# Patient Record
Sex: Female | Born: 1980 | Race: Black or African American | Hispanic: No | Smoking: Current every day smoker
Health system: Southern US, Community
[De-identification: ages and names within clinical notes are randomized; demographics above are authoritative.]

## PROBLEM LIST (undated history)

## (undated) DIAGNOSIS — Z789 Other specified health status: Secondary | ICD-10-CM

## (undated) DIAGNOSIS — N809 Endometriosis, unspecified: Secondary | ICD-10-CM

## (undated) DIAGNOSIS — F419 Anxiety disorder, unspecified: Secondary | ICD-10-CM

## (undated) HISTORY — PX: DILATION AND CURETTAGE OF UTERUS: SHX78

## (undated) HISTORY — PX: OTHER SURGICAL HISTORY: SHX169

---
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
## 2001-06-06 ENCOUNTER — Encounter: Payer: Self-pay | Admitting: Emergency Medicine

## 2001-06-06 ENCOUNTER — Emergency Department (HOSPITAL_COMMUNITY): Admission: EM | Admit: 2001-06-06 | Discharge: 2001-06-06 | Payer: Self-pay | Admitting: Emergency Medicine

## 2001-09-24 ENCOUNTER — Emergency Department (HOSPITAL_COMMUNITY): Admission: EM | Admit: 2001-09-24 | Discharge: 2001-09-24 | Payer: Self-pay | Admitting: Emergency Medicine

## 2001-11-16 ENCOUNTER — Emergency Department (HOSPITAL_COMMUNITY): Admission: EM | Admit: 2001-11-16 | Discharge: 2001-11-16 | Payer: Self-pay | Admitting: Emergency Medicine

## 2002-05-22 ENCOUNTER — Emergency Department (HOSPITAL_COMMUNITY): Admission: EM | Admit: 2002-05-22 | Discharge: 2002-05-22 | Payer: Self-pay | Admitting: Emergency Medicine

## 2003-07-04 ENCOUNTER — Inpatient Hospital Stay (HOSPITAL_COMMUNITY): Admission: AD | Admit: 2003-07-04 | Discharge: 2003-07-04 | Payer: Self-pay | Admitting: Obstetrics & Gynecology

## 2003-07-08 ENCOUNTER — Inpatient Hospital Stay (HOSPITAL_COMMUNITY): Admission: AD | Admit: 2003-07-08 | Discharge: 2003-07-08 | Payer: Self-pay | Admitting: Obstetrics & Gynecology

## 2004-01-18 ENCOUNTER — Emergency Department (HOSPITAL_COMMUNITY): Admission: EM | Admit: 2004-01-18 | Discharge: 2004-01-18 | Payer: Self-pay | Admitting: Internal Medicine

## 2004-05-20 ENCOUNTER — Emergency Department (HOSPITAL_COMMUNITY): Admission: EM | Admit: 2004-05-20 | Discharge: 2004-05-20 | Payer: Self-pay | Admitting: Emergency Medicine

## 2005-03-14 ENCOUNTER — Emergency Department (HOSPITAL_COMMUNITY): Admission: EM | Admit: 2005-03-14 | Discharge: 2005-03-14 | Payer: Self-pay | Admitting: Emergency Medicine

## 2005-03-18 ENCOUNTER — Inpatient Hospital Stay (HOSPITAL_COMMUNITY): Admission: AD | Admit: 2005-03-18 | Discharge: 2005-03-18 | Payer: Self-pay | Admitting: Obstetrics

## 2007-01-15 ENCOUNTER — Emergency Department (HOSPITAL_COMMUNITY): Admission: EM | Admit: 2007-01-15 | Discharge: 2007-01-15 | Payer: Self-pay | Admitting: *Deleted

## 2007-02-25 ENCOUNTER — Emergency Department (HOSPITAL_COMMUNITY): Admission: EM | Admit: 2007-02-25 | Discharge: 2007-02-25 | Payer: Self-pay | Admitting: Emergency Medicine

## 2008-04-13 ENCOUNTER — Inpatient Hospital Stay (HOSPITAL_COMMUNITY): Admission: AD | Admit: 2008-04-13 | Discharge: 2008-04-13 | Payer: Self-pay | Admitting: Obstetrics & Gynecology

## 2008-05-05 ENCOUNTER — Emergency Department (HOSPITAL_COMMUNITY): Admission: EM | Admit: 2008-05-05 | Discharge: 2008-05-05 | Payer: Self-pay | Admitting: Emergency Medicine

## 2008-09-11 ENCOUNTER — Inpatient Hospital Stay (HOSPITAL_COMMUNITY): Admission: AD | Admit: 2008-09-11 | Discharge: 2008-09-12 | Payer: Self-pay | Admitting: Obstetrics & Gynecology

## 2009-02-04 ENCOUNTER — Ambulatory Visit: Payer: Self-pay | Admitting: Family Medicine

## 2009-02-04 ENCOUNTER — Inpatient Hospital Stay (HOSPITAL_COMMUNITY): Admission: AD | Admit: 2009-02-04 | Discharge: 2009-02-04 | Payer: Self-pay | Admitting: Obstetrics & Gynecology

## 2009-05-06 ENCOUNTER — Emergency Department (HOSPITAL_COMMUNITY): Admission: EM | Admit: 2009-05-06 | Discharge: 2009-05-06 | Payer: Self-pay | Admitting: Emergency Medicine

## 2009-12-04 ENCOUNTER — Emergency Department (HOSPITAL_COMMUNITY)
Admission: EM | Admit: 2009-12-04 | Discharge: 2009-12-04 | Payer: Self-pay | Source: Home / Self Care | Admitting: Emergency Medicine

## 2010-03-14 LAB — POCT PREGNANCY, URINE: Preg Test, Ur: NEGATIVE

## 2010-03-21 LAB — COMPREHENSIVE METABOLIC PANEL
ALT: 12 U/L (ref 0–35)
AST: 24 U/L (ref 0–37)
Albumin: 4 g/dL (ref 3.5–5.2)
Alkaline Phosphatase: 40 U/L (ref 39–117)
BUN: 14 mg/dL (ref 6–23)
Chloride: 112 mEq/L (ref 96–112)
GFR calc Af Amer: >60 mL/min (ref >60)
Potassium: 4.3 mEq/L (ref 3.5–5.1)
Sodium: 140 mEq/L (ref 135–145)
Total Bilirubin: 1 mg/dL (ref 0.3–1.2)
Total Protein: 7.3 g/dL (ref 6.0–8.3)

## 2010-03-21 LAB — URINALYSIS, ROUTINE W REFLEX MICROSCOPIC
Bilirubin Urine: NEGATIVE
Ketones, ur: NEGATIVE mg/dL
Nitrite: NEGATIVE
Specific Gravity, Urine: 1.024 (ref 1.005–1.030)
pH: 6 (ref 5.0–8.0)

## 2010-03-21 LAB — DIFFERENTIAL
Basophils Relative: 1 % (ref 0–1)
Eosinophils Absolute: 0.2 10*3/uL (ref 0.0–0.7)
Lymphocytes Relative: 33 % (ref 12–46)
Lymphs Abs: 2.5 10*3/uL (ref 0.7–4.0)
Neutro Abs: 4.4 10*3/uL (ref 1.7–7.7)

## 2010-03-21 LAB — GC/CHLAMYDIA PROBE AMP, GENITAL: GC Probe Amp, Genital: NEGATIVE

## 2010-03-21 LAB — CBC
Platelets: ADEQUATE 10*3/uL (ref 150–400)
RDW: 13.1 % (ref 11.5–15.5)
WBC: 7.7 10*3/uL (ref 4.0–10.5)

## 2010-03-21 LAB — WET PREP, GENITAL
Clue Cells Wet Prep HPF POC: NONE SEEN
Trich, Wet Prep: NONE SEEN
WBC, Wet Prep HPF POC: NONE SEEN

## 2010-03-21 LAB — URINE MICROSCOPIC-ADD ON

## 2010-03-21 LAB — HEMOCCULT GUIAC POC 1CARD (OFFICE): Fecal Occult Bld: POSITIVE

## 2010-03-24 LAB — URINALYSIS, ROUTINE W REFLEX MICROSCOPIC
Bilirubin Urine: NEGATIVE
Hgb urine dipstick: NEGATIVE
Nitrite: NEGATIVE
Protein, ur: NEGATIVE mg/dL
Urobilinogen, UA: 0.2 mg/dL (ref 0.0–1.0)

## 2010-04-07 LAB — COMPREHENSIVE METABOLIC PANEL
AST: 23 U/L (ref 0–37)
BUN: 13 mg/dL (ref 6–23)
CO2: 26 mEq/L (ref 19–32)
Chloride: 103 mEq/L (ref 96–112)
Creatinine, Ser: 0.89 mg/dL (ref 0.4–1.2)
GFR calc Af Amer: >60 mL/min (ref >60)
GFR calc non Af Amer: >60 mL/min (ref >60)
Glucose, Bld: 77 mg/dL (ref 70–99)
Total Bilirubin: 0.7 mg/dL (ref 0.3–1.2)

## 2010-04-07 LAB — CBC
HCT: 41 % (ref 36.0–46.0)
Hemoglobin: 13.8 g/dL (ref 12.0–15.0)
MCV: 96.8 fL (ref 78.0–100.0)
RBC: 4.24 MIL/uL (ref 3.87–5.11)
WBC: 8.9 10*3/uL (ref 4.0–10.5)

## 2010-04-07 LAB — WET PREP, GENITAL
Clue Cells Wet Prep HPF POC: NONE SEEN
Trich, Wet Prep: NONE SEEN

## 2010-04-07 LAB — URINALYSIS, ROUTINE W REFLEX MICROSCOPIC
Glucose, UA: NEGATIVE mg/dL
Hgb urine dipstick: NEGATIVE
Ketones, ur: 15 mg/dL — AB
Protein, ur: NEGATIVE mg/dL
Urobilinogen, UA: 0.2 mg/dL (ref 0.0–1.0)

## 2010-04-07 LAB — GC/CHLAMYDIA PROBE AMP, GENITAL
Chlamydia, DNA Probe: NEGATIVE
GC Probe Amp, Genital: NEGATIVE

## 2010-04-25 ENCOUNTER — Inpatient Hospital Stay (HOSPITAL_COMMUNITY)
Admission: AD | Admit: 2010-04-25 | Discharge: 2010-04-25 | Disposition: A | Discharge status: Home / Self Care | Payer: Self-pay | Source: Ambulatory Visit | Attending: Obstetrics & Gynecology | Admitting: Obstetrics & Gynecology

## 2010-04-25 DIAGNOSIS — R109 Unspecified abdominal pain: Secondary | ICD-10-CM

## 2010-04-25 DIAGNOSIS — R197 Diarrhea, unspecified: Secondary | ICD-10-CM

## 2010-04-25 LAB — URINALYSIS, ROUTINE W REFLEX MICROSCOPIC
Glucose, UA: NEGATIVE mg/dL
Protein, ur: NEGATIVE mg/dL

## 2010-04-25 LAB — WET PREP, GENITAL: Trich, Wet Prep: NONE SEEN

## 2010-04-25 LAB — URINE MICROSCOPIC-ADD ON

## 2010-04-26 LAB — GC/CHLAMYDIA PROBE AMP, GENITAL: GC Probe Amp, Genital: NEGATIVE

## 2010-05-10 ENCOUNTER — Emergency Department (HOSPITAL_COMMUNITY)
Admission: EM | Admit: 2010-05-10 | Discharge: 2010-05-10 | Disposition: A | Discharge status: Home / Self Care | Payer: Self-pay | Attending: Emergency Medicine | Admitting: Emergency Medicine

## 2010-05-10 DIAGNOSIS — J069 Acute upper respiratory infection, unspecified: Secondary | ICD-10-CM | POA: Insufficient documentation

## 2010-05-10 DIAGNOSIS — J029 Acute pharyngitis, unspecified: Secondary | ICD-10-CM | POA: Insufficient documentation

## 2010-05-10 DIAGNOSIS — J3489 Other specified disorders of nose and nasal sinuses: Secondary | ICD-10-CM | POA: Insufficient documentation

## 2010-05-10 DIAGNOSIS — R059 Cough, unspecified: Secondary | ICD-10-CM | POA: Insufficient documentation

## 2010-05-10 DIAGNOSIS — R05 Cough: Secondary | ICD-10-CM | POA: Insufficient documentation

## 2010-05-10 LAB — RAPID STREP SCREEN (MED CTR MEBANE ONLY): Streptococcus, Group A Screen (Direct): NEGATIVE

## 2010-09-20 LAB — POCT PREGNANCY, URINE: Preg Test, Ur: NEGATIVE

## 2010-09-22 LAB — URINALYSIS, ROUTINE W REFLEX MICROSCOPIC
Glucose, UA: NEGATIVE
Hgb urine dipstick: NEGATIVE
Protein, ur: 30 — AB
pH: 6.5

## 2010-09-22 LAB — URINE MICROSCOPIC-ADD ON

## 2010-10-24 ENCOUNTER — Encounter (HOSPITAL_COMMUNITY): Payer: Self-pay | Admitting: *Deleted

## 2010-10-24 ENCOUNTER — Inpatient Hospital Stay (HOSPITAL_COMMUNITY)
Admission: AD | Admit: 2010-10-24 | Discharge: 2010-10-24 | Disposition: A | Discharge status: Home / Self Care | Payer: PRIVATE HEALTH INSURANCE | Source: Ambulatory Visit | Attending: Obstetrics & Gynecology | Admitting: Obstetrics & Gynecology

## 2010-10-24 DIAGNOSIS — N898 Other specified noninflammatory disorders of vagina: Secondary | ICD-10-CM

## 2010-10-24 DIAGNOSIS — L293 Anogenital pruritus, unspecified: Secondary | ICD-10-CM

## 2010-10-24 DIAGNOSIS — N899 Noninflammatory disorder of vagina, unspecified: Secondary | ICD-10-CM | POA: Insufficient documentation

## 2010-10-24 HISTORY — DX: Other specified health status: Z78.9

## 2010-10-24 LAB — URINALYSIS, ROUTINE W REFLEX MICROSCOPIC
Glucose, UA: NEGATIVE mg/dL
Nitrite: NEGATIVE
Protein, ur: NEGATIVE mg/dL
Urobilinogen, UA: 1 mg/dL (ref 0.0–1.0)

## 2010-10-24 LAB — WET PREP, GENITAL
Clue Cells Wet Prep HPF POC: NONE SEEN
WBC, Wet Prep HPF POC: NONE SEEN

## 2010-10-24 LAB — POCT PREGNANCY, URINE: Preg Test, Ur: NEGATIVE

## 2010-10-24 LAB — URINE MICROSCOPIC-ADD ON

## 2010-10-24 MED ORDER — TERCONAZOLE 0.4 % VA CREA: 1.0000 | TOPICAL_CREAM | Freq: Every day | VAGINAL | Status: AC

## 2010-10-24 NOTE — Progress Notes (Signed)
Patient states she has been having discomfort with urinating for 2 days. Vaginal burning.

## 2010-10-24 NOTE — ED Provider Notes (Signed)
History     Chief Complaint  Patient presents with  . Dysuria   HPIShareena N Jones29 y.o. reports having discomfort around the urethra when she wipes.  Denies dysuria, hematuria.  LMP 10/03/10, several days longer than normal.   Sexually active X1 new partner for 2 months.   Denies abnormal vaginal bleeding.  Does not use contraception.    Past Medical History  Diagnosis Date  . No pertinent past medical history     Past Surgical History  Procedure Date  . Wisdom teeth extraction     No family history on file.  History  Substance Use Topics  . Smoking status: Current Everyday Smoker -- 0.5 packs/day  . Smokeless tobacco: Not on file  . Alcohol Use: 1.0 oz/week    2 drink(s) per week    Allergies: Not on File  No prescriptions prior to admission    Review of Systems  Genitourinary: Negative for dysuria.       Vaginal discomfort with urination.  Denies abnormal vaginal.    Physical Exam   Blood pressure 110/58, pulse 86, temperature 99.3 F (37.4 C), temperature source Oral, resp. rate 16, height 5\' 2"  (1.575 m), weight 181 lb 9.6 oz (82.373 kg), last menstrual period 10/03/2010, SpO2 97.00%.  Physical Exam  Constitutional: She is oriented to person, place, and time. She appears well-developed and well-nourished. No distress.  GI: Soft. She exhibits no distension and no mass. There is no tenderness. There is no rebound and no guarding.  Genitourinary: Uterus is not enlarged and not tender. Cervix exhibits no motion tenderness, no discharge and no friability. Right adnexum displays no mass, no tenderness and no fullness. Left adnexum displays no mass and no tenderness. No erythema or bleeding around the vagina. Vaginal discharge (thick white discharge without odor, metrogel inserted last night) found.       Tenderness around the urethra.  Neurological: She is alert and oriented to person, place, and time.  Skin: Skin is warm and dry.       Results for orders  placed during the hospital encounter of 10/24/10 (from the past 24 hour(s))  URINALYSIS, ROUTINE W REFLEX MICROSCOPIC     Status: Abnormal   Collection Time   10/24/10  3:35 PM      Component Value Range   Color, Urine YELLOW  YELLOW    Appearance CLEAR  CLEAR    Specific Gravity, Urine 1.025  1.005 - 1.030    pH 6.0  5.0 - 8.0    Glucose, UA NEGATIVE  NEGATIVE (mg/dL)   Hgb urine dipstick NEGATIVE  NEGATIVE    Bilirubin Urine NEGATIVE  NEGATIVE    Ketones, ur NEGATIVE  NEGATIVE (mg/dL)   Protein, ur NEGATIVE  NEGATIVE (mg/dL)   Urobilinogen, UA 1.0  0.0 - 1.0 (mg/dL)   Nitrite NEGATIVE  NEGATIVE    Leukocytes, UA SMALL (*) NEGATIVE   URINE MICROSCOPIC-ADD ON     Status: Abnormal   Collection Time   10/24/10  3:35 PM      Component Value Range   Squamous Epithelial / LPF FEW (*) RARE    WBC, UA 0-2  <3 (WBC/hpf)   Bacteria, UA FEW (*) RARE   POCT PREGNANCY, URINE     Status: Normal   Collection Time   10/24/10  4:09 PM      Component Value Range   Preg Test, Ur NEGATIVE     MAU Course  Procedures  GC/CHl culture to lab  MDM  Assessment and Plan  A: Vaginal irritation  P: informed patient that we would call if her cultures were positive.  She would like tx for yeast to soothe the irritation.  She did use metrogel several weeks ago and feels this may be the cause.    Jenna Hooper,EVE M 10/24/2010, 5:59 PM   Jenna Holmes, NP 10/24/10 1950  Jenna Holmes, NP 10/26/10 1337

## 2010-10-24 NOTE — Progress Notes (Signed)
Pt states she is having pain in her vagina and her lower stomach when she urinates.

## 2010-10-25 LAB — GC/CHLAMYDIA PROBE AMP, GENITAL
Chlamydia, DNA Probe: NEGATIVE
GC Probe Amp, Genital: NEGATIVE

## 2010-10-26 NOTE — ED Provider Notes (Signed)
Attestation of Attending Supervision of Advanced Practitioner: Evaluation and management procedures were performed by the PA/NP/CNM/OB Fellow under my supervision/collaboration. Chart reviewed and agree with management and plan.  Jaynie Collins A M.D. 10/26/2010 5:26 PM

## 2010-10-29 ENCOUNTER — Emergency Department (HOSPITAL_COMMUNITY): Payer: No Typology Code available for payment source

## 2010-10-29 ENCOUNTER — Emergency Department (HOSPITAL_COMMUNITY)
Admission: EM | Admit: 2010-10-29 | Discharge: 2010-10-29 | Disposition: A | Discharge status: Home / Self Care | Payer: No Typology Code available for payment source | Attending: Emergency Medicine | Admitting: Emergency Medicine

## 2010-10-29 DIAGNOSIS — M25529 Pain in unspecified elbow: Secondary | ICD-10-CM | POA: Insufficient documentation

## 2010-10-29 DIAGNOSIS — T1490XA Injury, unspecified, initial encounter: Secondary | ICD-10-CM | POA: Insufficient documentation

## 2010-10-29 DIAGNOSIS — Y92410 Unspecified street and highway as the place of occurrence of the external cause: Secondary | ICD-10-CM | POA: Insufficient documentation

## 2010-10-29 DIAGNOSIS — S5000XA Contusion of unspecified elbow, initial encounter: Secondary | ICD-10-CM | POA: Insufficient documentation

## 2010-11-13 ENCOUNTER — Emergency Department (HOSPITAL_COMMUNITY)
Admission: EM | Admit: 2010-11-13 | Discharge: 2010-11-14 | Discharge status: Against Medical Advice | Payer: PRIVATE HEALTH INSURANCE | Attending: Emergency Medicine | Admitting: Emergency Medicine

## 2010-11-13 ENCOUNTER — Encounter (HOSPITAL_COMMUNITY): Payer: Self-pay | Admitting: Emergency Medicine

## 2010-11-13 DIAGNOSIS — M549 Dorsalgia, unspecified: Secondary | ICD-10-CM | POA: Insufficient documentation
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
## 2010-12-13 ENCOUNTER — Emergency Department (HOSPITAL_COMMUNITY): Payer: Self-pay

## 2010-12-13 ENCOUNTER — Emergency Department (HOSPITAL_COMMUNITY)
Admission: EM | Admit: 2010-12-13 | Discharge: 2010-12-13 | Disposition: A | Discharge status: Home / Self Care | Payer: Self-pay | Attending: Emergency Medicine | Admitting: Emergency Medicine

## 2010-12-13 ENCOUNTER — Encounter (HOSPITAL_COMMUNITY): Payer: Self-pay | Admitting: Emergency Medicine

## 2010-12-13 DIAGNOSIS — R05 Cough: Secondary | ICD-10-CM

## 2010-12-13 DIAGNOSIS — R059 Cough, unspecified: Secondary | ICD-10-CM | POA: Insufficient documentation

## 2010-12-13 DIAGNOSIS — R0789 Other chest pain: Secondary | ICD-10-CM | POA: Insufficient documentation

## 2010-12-13 DIAGNOSIS — R0989 Other specified symptoms and signs involving the circulatory and respiratory systems: Secondary | ICD-10-CM | POA: Insufficient documentation

## 2010-12-13 DIAGNOSIS — R509 Fever, unspecified: Secondary | ICD-10-CM | POA: Insufficient documentation

## 2010-12-13 MED ORDER — IPRATROPIUM BROMIDE 0.02 % IN SOLN
0.5000 mg | RESPIRATORY_TRACT | Status: AC
  Administered 2010-12-13: 0.5 mg via RESPIRATORY_TRACT
  Filled 2010-12-13: qty 2.5

## 2010-12-13 MED ORDER — HYDROCODONE-HOMATROPINE 5-1.5 MG/5ML PO SYRP: 5.0000 mL | ORAL_SOLUTION | Freq: Four times a day (QID) | ORAL | Status: DC | PRN

## 2010-12-13 MED ORDER — PHENYLEPHRINE-CHLORPHEN-DM 12.5-4-15 MG/5ML PO SYRP: 2.5000 mL | ORAL_SOLUTION | ORAL | Status: DC

## 2010-12-13 MED ORDER — ALBUTEROL SULFATE (5 MG/ML) 0.5% IN NEBU
2.5000 mg | INHALATION_SOLUTION | RESPIRATORY_TRACT | Status: AC
  Administered 2010-12-13: 2.5 mg via RESPIRATORY_TRACT
  Filled 2010-12-13: qty 0.5

## 2010-12-13 NOTE — ED Provider Notes (Signed)
History     CSN: [id number] Arrival date & time: 12/13/2010 10:08 AM   First MD Initiated Contact with Patient 12/13/10 1033      Chief Complaint  Patient presents with  . Sore Throat    Fever sore throat x 2 days, cough productive when reclining    (Consider location/radiation/quality/duration/timing/severity/associated sxs/prior treatment) HPI Comments: Patient presents with flulike symptoms consisting of fever, cough and chills that began on Friday.  Sunday the patient took her temperature and had a fever of 103.  She's been treating her cough and fever with over-the-counter therapies and currently does not have a temperature.  She is more concerned about her consistent cough.  Her son was recently diagnosed with an upper respiratory tract infection and she feels as though she caught it.  Patient denies nausea, vomiting, diarrhea, chest pain, shortness of breath, DOE, and abdominal pain.  She has no other complaints.  Patient is a 30 y.o. female presenting with fever and cough. The history is provided by the patient.  Fever Primary symptoms of the febrile illness include fever, cough and wheezing. Primary symptoms do not include fatigue, headaches, shortness of breath, abdominal pain, nausea, vomiting, diarrhea, dysuria or myalgias. The current episode started 6 to 7 days ago. This is a new problem. The problem has not changed since onset. The fever began 2 days ago. The fever has been resolved since its onset. The maximum temperature recorded prior to her arrival was 102 to 102.9 F.  The patient's medical history does not include asthma or COPD.  Cough This is a new problem. The current episode started 2 days ago. The problem occurs constantly. The problem has not changed since onset.The cough is non-productive. The maximum temperature recorded prior to her arrival was 102 to 102.9 F. Associated symptoms include chills and wheezing. Pertinent negatives include no chest pain, no ear pain,  no headaches, no rhinorrhea, no sore throat, no myalgias and no shortness of breath. She has tried cough syrup for the symptoms. The treatment provided mild relief. She is a smoker. Her past medical history does not include pneumonia, COPD or asthma.    Past Medical History  Diagnosis Date  . No pertinent past medical history     Past Surgical History  Procedure Date  . Wisdom teeth extraction     History reviewed. No pertinent family history.  History  Substance Use Topics  . Smoking status: Current Some Day Smoker -- 0.5 packs/day  . Smokeless tobacco: Not on file  . Alcohol Use: No    OB History    Grav Para Term Preterm Abortions TAB SAB Ect Mult Living   2 1 1  0 1 1 0 0 0 1      Review of Systems  Constitutional: Positive for fever and chills. Negative for appetite change and fatigue.  HENT: Negative for hearing loss, ear pain, nosebleeds, congestion, sore throat, rhinorrhea, sneezing, trouble swallowing, neck stiffness, voice change, postnasal drip, sinus pressure, tinnitus and ear discharge.   Eyes: Negative for photophobia and visual disturbance.  Respiratory: Positive for cough, chest tightness and wheezing. Negative for apnea, choking, shortness of breath and stridor.   Cardiovascular: Negative for chest pain, palpitations and leg swelling.  Gastrointestinal: Negative for nausea, vomiting, abdominal pain, diarrhea and constipation.  Genitourinary: Negative for dysuria, urgency and flank pain.  Musculoskeletal: Negative for myalgias.  Neurological: Negative for dizziness, seizures, syncope, weakness, light-headedness, numbness and headaches.  Psychiatric/Behavioral: Negative for behavioral problems and confusion.  All other  systems reviewed and are negative.    Allergies  Review of patient's allergies indicates no known allergies.  Home Medications   Current Outpatient Rx  Name Route Sig Dispense Refill  . CYCLOBENZAPRINE HCL 10 MG PO TABS Oral Take 10 mg by  mouth 3 (three) times daily as needed. SPASMS     . IBUPROFEN 800 MG PO TABS Oral Take 800 mg by mouth every 8 (eight) hours as needed. For pain.     Marland Kitchen LORATADINE 10 MG PO TABS Oral Take 10 mg by mouth daily as needed. For allergies.     Marland Kitchen METHOCARBAMOL 500 MG PO TABS Oral Take 500 mg by mouth 4 (four) times daily.      Juanita Laster FLU/COLD/COUGH PO Oral Take 30 mLs by mouth every 4 (four) hours as needed. FLU SYMPTOMS     . TRAMADOL HCL 50 MG PO TBDP Oral Take 50 mg by mouth daily.        BP 96/60  Pulse 68  Temp(Src) 98 F (36.7 C) (Oral)  Resp 22  SpO2 100%  LMP 11/29/2010  Physical Exam  Nursing note and vitals reviewed. Constitutional: She is oriented to person, place, and time. She appears well-developed and well-nourished. No distress.  HENT:  Head: No trismus in the jaw.  Right Ear: Tympanic membrane, external ear and ear canal normal.  Left Ear: Tympanic membrane, external ear and ear canal normal.  Nose: Nose normal.  Mouth/Throat: Uvula is midline and mucous membranes are normal. Mucous membranes are not pale and not dry. No uvula swelling. No oropharyngeal exudate, posterior oropharyngeal edema, posterior oropharyngeal erythema or tonsillar abscesses.  Eyes:       Normal appearance  Neck: Normal range of motion and full passive range of motion without pain. Neck supple. No rigidity.  Cardiovascular: Normal rate and regular rhythm.   Pulmonary/Chest: Effort normal. She has wheezes. She exhibits no tenderness.  Lymphadenopathy:    She has no cervical adenopathy.  Neurological: She is alert and oriented to person, place, and time.  Skin: Skin is warm and dry. No rash noted.  Psychiatric: She has a normal mood and affect. Her behavior is normal.    ED Course  Procedures (including critical care time)  Labs Reviewed - No data to display Dg Chest 2 View  12/13/2010  *RADIOLOGY REPORT*  Clinical Data: Cough, weakness, fever  CHEST - 2 VIEW  Comparison: 12/04/2009   Findings: Cardiomediastinal silhouette is stable.  No acute infiltrate or pleural effusion.  Central mild increase bronchial markings suspicious for mild bronchitic changes.  IMPRESSION: No acute infiltrate or pleural effusion.  Central mild increase bronchial markings suspicious for bronchitic changes.  Original Report Authenticated By: Natasha Mead, M.D.     1. Cough   2. Congestion     Patient's lungs clear to auscultation after neb treatment.  Chest x-ray ordered to rule out pneumonia.  Patient to be discharged with Hycodan for both pain and cough.  X-ray results discussed with patient.  She states she will followup with a primary care Dr.  MDM  Cough, Fever    Medical screening examination/treatment/procedure(s) were performed by non-physician practitioner and as supervising physician I was immediately available for consultation/collaboration. Osvaldo Human, M.D.    [place], [place] 12/13/10 1201  Carleene Cooper III, MD 12/13/10 [phone]

## 2010-12-13 NOTE — ED Notes (Signed)
Respiratory Tx at bedside

## 2011-03-02 ENCOUNTER — Emergency Department (HOSPITAL_COMMUNITY)
Admission: EM | Admit: 2011-03-02 | Discharge: 2011-03-02 | Disposition: A | Discharge status: Home / Self Care | Payer: Self-pay | Attending: Emergency Medicine | Admitting: Emergency Medicine

## 2011-03-02 ENCOUNTER — Encounter (HOSPITAL_COMMUNITY): Payer: Self-pay | Admitting: *Deleted

## 2011-03-02 DIAGNOSIS — K044 Acute apical periodontitis of pulpal origin: Secondary | ICD-10-CM | POA: Insufficient documentation

## 2011-03-02 DIAGNOSIS — K047 Periapical abscess without sinus: Secondary | ICD-10-CM

## 2011-03-02 DIAGNOSIS — K029 Dental caries, unspecified: Secondary | ICD-10-CM | POA: Insufficient documentation

## 2011-03-02 DIAGNOSIS — F17200 Nicotine dependence, unspecified, uncomplicated: Secondary | ICD-10-CM | POA: Insufficient documentation

## 2011-03-02 MED ORDER — PENICILLIN V POTASSIUM 500 MG PO TABS: 500.0000 mg | ORAL_TABLET | Freq: Four times a day (QID) | ORAL | Status: DC

## 2011-03-02 MED ORDER — OXYCODONE-ACETAMINOPHEN 5-325 MG PO TABS: 1.0000 | ORAL_TABLET | Freq: Four times a day (QID) | ORAL | Status: AC | PRN

## 2011-03-02 MED ORDER — PENICILLIN V POTASSIUM 500 MG PO TABS: 500.0000 mg | ORAL_TABLET | Freq: Four times a day (QID) | ORAL | Status: AC

## 2011-03-02 MED ORDER — OXYCODONE-ACETAMINOPHEN 5-325 MG PO TABS
1.0000 | ORAL_TABLET | Freq: Once | ORAL | Status: AC
  Administered 2011-03-02: 1 via ORAL
  Filled 2011-03-02: qty 1

## 2011-03-02 NOTE — ED Notes (Signed)
Pt reports toothache since November.  Pain increased tonight.  Lower (L) molar pain.  No swelling noted.  Pt very tearful.

## 2011-03-02 NOTE — ED Notes (Signed)
Pt appears in great distress is guarding mouth.

## 2011-03-02 NOTE — ED Provider Notes (Signed)
Medical screening examination/treatment/procedure(s) were performed by non-physician practitioner and as supervising physician I was immediately available for consultation/collaboration.   Vida Roller, MD 03/02/11 [phone]

## 2011-03-02 NOTE — ED Provider Notes (Signed)
History     CSN: [id number]  Arrival date & time 03/02/11  7829   First MD Initiated Contact with Patient 03/02/11 [phone]      Chief Complaint  Patient presents with  . Dental Pain    (Consider location/radiation/quality/duration/timing/severity/associated sxs/prior treatment) Patient is a 31 y.o. female presenting with tooth pain. The history is provided by the patient.  Dental Pain Pt reports dental pain to bottom left 2nd molar x 4 months, gradually worsening until last night when it became too severe for her to sleep. It has been associated with nausea x 2 weeks as well as mild HA. There has been no fever, chills, visual change, ear pain, tinnitus, trouble swallowing, neck pain/stiffness, SP, SOB, or vomiting. She is a smoker. Nothing makes the symptoms better or worse. No prior tx.  Past Medical History  Diagnosis Date  . No pertinent past medical history     Past Surgical History  Procedure Date  . Wisdom teeth extraction     History reviewed. No pertinent family history.  History  Substance Use Topics  . Smoking status: Current Some Day Smoker -- 0.5 packs/day  . Smokeless tobacco: Not on file  . Alcohol Use: No    OB History    Grav Para Term Preterm Abortions TAB SAB Ect Mult Living   2 1 1  0 1 1 0 0 0 1      Review of Systems 10 systems reviewed and are negative for acute change except as noted in the HPI.  Allergies  Review of patient's allergies indicates no known allergies.  Home Medications   Current Outpatient Rx  Name Route Sig Dispense Refill  . BC FAST PAIN RELIEF PO Oral Take 1 packet by mouth daily as needed. For tooth pain    . CYCLOBENZAPRINE HCL 10 MG PO TABS Oral Take 10 mg by mouth 3 (three) times daily as needed. SPASMS     . IBUPROFEN 800 MG PO TABS Oral Take 800 mg by mouth every 8 (eight) hours as needed. For pain.     Marland Kitchen METHOCARBAMOL 500 MG PO TABS Oral Take 500 mg by mouth 4 (four) times daily as needed. For muscle spasms      BP  126/75  Pulse 105  Temp(Src) 99.2 F (37.3 C) (Oral)  Resp 24  SpO2 96%  Physical Exam  Constitutional: She is oriented to person, place, and time. She appears well-developed and well-nourished. She appears distressed.  HENT:  Head: Normocephalic and atraumatic. No trismus in the jaw.  Right Ear: External ear normal.  Left Ear: External ear normal.  Mouth/Throat: Uvula is midline. No uvula swelling. No oropharyngeal exudate, posterior oropharyngeal edema or posterior oropharyngeal erythema.    Eyes: Conjunctivae are normal. Pupils are equal, round, and reactive to light.  Neck: Normal range of motion. Neck supple.       Left anterior cervical LAD.   Cardiovascular: Regular rhythm and intact distal pulses.        Rate 100 on palpation at time of examination  Pulmonary/Chest: Effort normal. No stridor. No respiratory distress.  Musculoskeletal: She exhibits no edema and no tenderness.  Neurological: She is alert and oriented to person, place, and time. No cranial nerve deficit.  Skin: Skin is warm and dry.    ED Course  Procedures (including critical care time)  Labs Reviewed - No data to display No results found.   1. Dental infection       MDM  Dental pain in  otherwise healthy female. Suspect dental abscess given likely longstanding caries with current PE findings. No fever. No s/s ludwig angina. Strongly advised close dental follow-up. Pt and family voice understanding. Will give rx for pcn and pain medication.        Shaaron Adler, [place] 03/02/11 [phone]

## 2011-08-08 ENCOUNTER — Other Ambulatory Visit (HOSPITAL_COMMUNITY)
Admission: RE | Admit: 2011-08-08 | Discharge: 2011-08-08 | Disposition: A | Discharge status: Home / Self Care | Payer: Commercial Indemnity | Source: Ambulatory Visit | Attending: Family Medicine | Admitting: Family Medicine

## 2011-08-08 DIAGNOSIS — Z124 Encounter for screening for malignant neoplasm of cervix: Secondary | ICD-10-CM | POA: Insufficient documentation

## 2011-08-08 DIAGNOSIS — Z1151 Encounter for screening for human papillomavirus (HPV): Secondary | ICD-10-CM | POA: Insufficient documentation

## 2013-07-03 ENCOUNTER — Observation Stay (HOSPITAL_COMMUNITY)
Admission: AD | Admit: 2013-07-03 | Discharge: 2013-07-04 | Disposition: A | Discharge status: Home / Self Care | Payer: Self-pay | Source: Ambulatory Visit | Attending: Obstetrics and Gynecology | Admitting: Obstetrics and Gynecology

## 2013-07-03 ENCOUNTER — Inpatient Hospital Stay (HOSPITAL_COMMUNITY): Payer: PRIVATE HEALTH INSURANCE

## 2013-07-03 ENCOUNTER — Encounter (HOSPITAL_COMMUNITY): Payer: Self-pay

## 2013-07-03 DIAGNOSIS — N83201 Unspecified ovarian cyst, right side: Secondary | ICD-10-CM | POA: Diagnosis not present

## 2013-07-03 DIAGNOSIS — N898 Other specified noninflammatory disorders of vagina: Principal | ICD-10-CM | POA: Insufficient documentation

## 2013-07-03 DIAGNOSIS — R102 Pelvic and perineal pain unspecified side: Secondary | ICD-10-CM | POA: Diagnosis present

## 2013-07-03 DIAGNOSIS — O26899 Other specified pregnancy related conditions, unspecified trimester: Secondary | ICD-10-CM | POA: Diagnosis present

## 2013-07-03 DIAGNOSIS — R112 Nausea with vomiting, unspecified: Secondary | ICD-10-CM | POA: Diagnosis present

## 2013-07-03 DIAGNOSIS — F17200 Nicotine dependence, unspecified, uncomplicated: Secondary | ICD-10-CM | POA: Diagnosis present

## 2013-07-03 DIAGNOSIS — N939 Abnormal uterine and vaginal bleeding, unspecified: Secondary | ICD-10-CM | POA: Diagnosis present

## 2013-07-03 DIAGNOSIS — N946 Dysmenorrhea, unspecified: Secondary | ICD-10-CM | POA: Diagnosis present

## 2013-07-03 HISTORY — DX: Endometriosis, unspecified: N80.9

## 2013-07-03 HISTORY — DX: Anxiety disorder, unspecified: F41.9

## 2013-07-03 LAB — CBC WITH DIFFERENTIAL/PLATELET
BASOS ABS: 0 10*3/uL (ref 0.0–0.1)
Basophils Relative: 1 % (ref 0–1)
Eosinophils Absolute: 0.1 10*3/uL (ref 0.0–0.7)
Eosinophils Relative: 2 % (ref 0–5)
HEMATOCRIT: 38.1 % (ref 36.0–46.0)
Hemoglobin: 13.1 g/dL (ref 12.0–15.0)
LYMPHS PCT: 36 % (ref 12–46)
Lymphs Abs: 2.6 10*3/uL (ref 0.7–4.0)
MCH: 32.4 pg (ref 26.0–34.0)
MCHC: 34.4 g/dL (ref 30.0–36.0)
MCV: 94.3 fL (ref 78.0–100.0)
MONO ABS: 0.4 10*3/uL (ref 0.1–1.0)
MONOS PCT: 5 % (ref 3–12)
Neutro Abs: 4.2 10*3/uL (ref 1.7–7.7)
Neutrophils Relative %: 56 % (ref 43–77)
Platelets: 232 10*3/uL (ref 150–400)
RBC: 4.04 MIL/uL (ref 3.87–5.11)
RDW: 12.9 % (ref 11.5–15.5)
WBC: 7.3 10*3/uL (ref 4.0–10.5)

## 2013-07-03 LAB — URINE MICROSCOPIC-ADD ON

## 2013-07-03 LAB — URINALYSIS, ROUTINE W REFLEX MICROSCOPIC
Bilirubin Urine: NEGATIVE
Glucose, UA: NEGATIVE mg/dL
Ketones, ur: 15 mg/dL — AB
NITRITE: NEGATIVE
Protein, ur: NEGATIVE mg/dL
Specific Gravity, Urine: 1.025 (ref 1.005–1.030)
Urobilinogen, UA: 1 mg/dL (ref 0.0–1.0)
pH: 6.5 (ref 5.0–8.0)

## 2013-07-03 LAB — POCT PREGNANCY, URINE: Preg Test, Ur: NEGATIVE

## 2013-07-03 MED ORDER — KETOROLAC TROMETHAMINE 30 MG/ML IJ SOLN: 30.0000 mg | Freq: Once | INTRAMUSCULAR | Status: DC

## 2013-07-03 MED ORDER — LACTATED RINGERS IV SOLN
INTRAVENOUS | Status: DC
  Administered 2013-07-03: 23:00:00 via INTRAVENOUS

## 2013-07-03 MED ORDER — LACTATED RINGERS IV SOLN: INTRAVENOUS | Status: DC

## 2013-07-03 MED ORDER — HYDROMORPHONE HCL PF 1 MG/ML IJ SOLN
2.0000 mg | INTRAMUSCULAR | Status: DC | PRN
  Administered 2013-07-04: 2 mg via INTRAVENOUS
  Filled 2013-07-03: qty 2

## 2013-07-03 MED ORDER — PROMETHAZINE HCL 25 MG/ML IJ SOLN: 25.0000 mg | Freq: Four times a day (QID) | INTRAMUSCULAR | Status: DC | PRN

## 2013-07-03 MED ORDER — LACTATED RINGERS IV BOLUS (SEPSIS)
250.0000 mL | Freq: Once | INTRAVENOUS | Status: AC
  Administered 2013-07-03: 250 mL via INTRAVENOUS

## 2013-07-03 MED ORDER — KETOROLAC TROMETHAMINE 30 MG/ML IJ SOLN
60.0000 mg | Freq: Once | INTRAMUSCULAR | Status: AC
  Administered 2013-07-03: 60 mg via INTRAMUSCULAR
  Filled 2013-07-03: qty 2

## 2013-07-03 MED ORDER — PROMETHAZINE HCL 25 MG/ML IJ SOLN
25.0000 mg | Freq: Once | INTRAMUSCULAR | Status: AC
  Administered 2013-07-03: 25 mg via INTRAVENOUS
  Filled 2013-07-03: qty 1

## 2013-07-03 MED ORDER — HYDROMORPHONE HCL PF 1 MG/ML IJ SOLN
2.0000 mg | INTRAMUSCULAR | Status: DC | PRN
  Filled 2013-07-03: qty 2

## 2013-07-03 MED ORDER — PNEUMOCOCCAL VAC POLYVALENT 25 MCG/0.5ML IJ INJ
0.5000 mL | INJECTION | INTRAMUSCULAR | Status: DC
  Filled 2013-07-03: qty 0.5

## 2013-07-03 MED ORDER — HYDROMORPHONE HCL PF 1 MG/ML IJ SOLN
1.0000 mg | INTRAMUSCULAR | Status: DC | PRN
  Administered 2013-07-03: 1 mg via INTRAVENOUS
  Filled 2013-07-03: qty 1

## 2013-07-03 MED ORDER — KETOROLAC TROMETHAMINE 30 MG/ML IJ SOLN
30.0000 mg | Freq: Four times a day (QID) | INTRAMUSCULAR | Status: DC | PRN
  Administered 2013-07-04: 30 mg via INTRAVENOUS
  Filled 2013-07-03: qty 1

## 2013-07-03 MED ORDER — HYDROMORPHONE HCL PF 1 MG/ML IJ SOLN
1.0000 mg | Freq: Once | INTRAMUSCULAR | Status: AC
  Administered 2013-07-03: 1 mg via INTRAVENOUS
  Filled 2013-07-03: qty 1

## 2013-07-03 MED ORDER — ONDANSETRON HCL 4 MG/2ML IJ SOLN
4.0000 mg | Freq: Once | INTRAMUSCULAR | Status: AC
  Administered 2013-07-03: 4 mg via INTRAVENOUS
  Filled 2013-07-03: qty 2

## 2013-07-03 NOTE — MAU Provider Note (Signed)
History   Pt presents to MAU for abd pain x 3 month and vaginal bleeding since 6/19, SP US, started on low dose OCPs and possible dx of endometriosis.  Pt also c/o n/v.  On Phenergen without benefit.   There are no active problems to display for this patient.   Chief Complaint  Patient presents with  . Abdominal Pain  . Vaginal Bleeding   HPI  See HPI above  OB History   Grav Para Term Preterm Abortions TAB SAB Ect Mult Living   2 1 1  0 1 1 0 0 0 1      Past Medical History  Diagnosis Date  . No pertinent past medical history   . Endometriosis   . Anxiety     Past Surgical History  Procedure Laterality Date  . Wisdom teeth extraction    . Dilation and curettage of uterus      History reviewed. No pertinent family history.  History  Substance Use Topics  . Smoking status: Current Every Day Smoker -- 0.50 packs/day  . Smokeless tobacco: Never Used  . Alcohol Use: No    Allergies: No Known Allergies  Prescriptions prior to admission  Medication Sig Dispense Refill  . cetirizine (ZYRTEC) 10 MG chewable tablet Chew 10 mg by mouth daily as needed for allergies.      Marland Kitchen. HYDROcodone-acetaminophen (NORCO/VICODIN) 5-325 MG per tablet Take 1-2 tablets by mouth every 6 (six) hours as needed for moderate pain.      Marland Kitchen. levonorgestrel-ethinyl estradiol (AVIANE,ALESSE,LESSINA) 0.1-20 MG-MCG tablet Take 1 tablet by mouth daily.      . naproxen (NAPROSYN) 375 MG tablet Take 375 mg by mouth 3 (three) times daily as needed for mild pain.      . promethazine (PHENERGAN) 25 MG tablet Take 25 mg by mouth every 6 (six) hours as needed for nausea or vomiting.        ROS Physical Exam   Blood pressure 113/71, pulse 72, temperature 98.8 F (37.1 C), temperature source Oral, resp. rate 16, height 5\' 2"  (1.575 m), weight 170 lb (77.111 kg), last menstrual period 06/19/2013.  Physical Exam SSE - minimum blood in the vaginal vault SVE - firm, no CMT, adnexa significant right lower  tenderness, no masses noted.  Left adnexa tender to palpation. No masses noted. Uterus small non tender. Non distended, no upper abd tenderness.  Difficult to access rebound d/t pelvic pain Negative UPT.  Results for orders placed during the hospital encounter of 07/03/13 (from the past 24 hour(s))  URINALYSIS, ROUTINE W REFLEX MICROSCOPIC     Status: Abnormal   Collection Time    07/03/13  4:45 PM      Result Value Ref Range   Color, Urine YELLOW  YELLOW   APPearance CLEAR  CLEAR   Specific Gravity, Urine 1.025  1.005 - 1.030   pH 6.5  5.0 - 8.0   Glucose, UA NEGATIVE  NEGATIVE mg/dL   Hgb urine dipstick LARGE (*) NEGATIVE   Bilirubin Urine NEGATIVE  NEGATIVE   Ketones, ur 15 (*) NEGATIVE mg/dL   Protein, ur NEGATIVE  NEGATIVE mg/dL   Urobilinogen, UA 1.0  0.0 - 1.0 mg/dL   Nitrite NEGATIVE  NEGATIVE   Leukocytes, UA TRACE (*) NEGATIVE  URINE MICROSCOPIC-ADD ON     Status: Abnormal   Collection Time    07/03/13  4:45 PM      Result Value Ref Range   Squamous Epithelial / LPF FEW (*) RARE  WBC, UA 3-6  <3 WBC/hpf   RBC / HPF 7-10  <3 RBC/hpf   Bacteria, UA FEW (*) RARE   Urine-Other MUCOUS PRESENT    POCT PREGNANCY, URINE     Status: None   Collection Time    07/03/13  4:49 PM      Result Value Ref Range   Preg Test, Ur NEGATIVE  NEGATIVE    ED Course  Assessment: Generalized pelvic pain, right greater than the left.   Hx of right corpus luteum cyst.   Minor vaginal bleeding secondary to new OCP start on 06/19/13  Plan: Tordadol 60mg  IM Consulted with Dr. Normand Sloopillard CBC w/diff US for RLQ pain,, RO torson IV hydration Zofran for nausea Dilaudid 1mg  q2 PRN Gerrit HeckJessica Emly CNM to FU.   Urine for culture    Coron Rossano CNM, MSN 07/03/2013 6:35 PM Pt

## 2013-07-03 NOTE — H&P (Signed)
Jenna Hooper is an 33 y.o. female. She was seen in the office on 6/18 and noted as having a right sided corpus luteum cyst that was resolving. However, patient presents this evening for abdominal pain and vaginal bleeding.  Right side abdominal/pelvic pain continues and is unrelieved with rx pain meds.  Patient also experiencing nausea related to the pain and is currently taking phenergan.  Patient given toradol, dilaudid, and zofran upon arrival.  Patient sent for US with results as below.  CBC, UA, and UPT completed and documented as below. Patient continues to complain of nausea and pain.   Pertinent Gynecological History: ontraception: OCP (estrogen/progesterone) for suspected endometriosis  Blood transfusions: none Sexually transmitted diseases: Unknown history Previous GYN Procedures: None  Last pap: normal Date: 05/26/13 OB History: G2, P1011   Menstrual History: Menarche age: [age]  Patient's last menstrual period was 06/19/2013.    Past Medical History  Diagnosis Date  . No pertinent past medical history   . Endometriosis   . Anxiety     Past Surgical History  Procedure Laterality Date  . Wisdom teeth extraction    . Dilation and curettage of uterus      History reviewed. No pertinent family history.  Social History:  reports that she has been smoking.  She has never used smokeless tobacco. She reports that she does not drink alcohol or use illicit drugs.  Allergies: No Known Allergies  Prescriptions prior to admission  Medication Sig Dispense Refill  . cetirizine (ZYRTEC) 10 MG chewable tablet Chew 10 mg by mouth daily as needed for allergies.      Marland Kitchen. HYDROcodone-acetaminophen (NORCO/VICODIN) 5-325 MG per tablet Take 1-2 tablets by mouth every 6 (six) hours as needed for moderate pain.      Marland Kitchen. levonorgestrel-ethinyl estradiol (AVIANE,ALESSE,LESSINA) 0.1-20 MG-MCG tablet Take 1 tablet by mouth daily.      . naproxen (NAPROSYN) 375 MG tablet Take 375 mg by mouth 3 (three)  times daily as needed for mild pain.      . promethazine (PHENERGAN) 25 MG tablet Take 25 mg by mouth every 6 (six) hours as needed for nausea or vomiting.        Review of Systems  Constitutional: Negative.   HENT: Negative.   Eyes: Negative.   Respiratory: Negative.   Cardiovascular: Negative.   Gastrointestinal: Positive for nausea, vomiting and abdominal pain. Negative for heartburn, diarrhea, constipation and blood in stool.  Genitourinary: Negative.   Skin: Negative.   Endo/Heme/Allergies: Negative.    Blood pressure 113/71, pulse 72, temperature 98.8 F (37.1 C), temperature source Oral, resp. rate 16, height 5\' 2"  (1.575 m), weight 170 lb (77.111 kg), last menstrual period 06/19/2013. Physical Exam  Results for orders placed during the hospital encounter of 07/03/13 (from the past 24 hour(s))  URINALYSIS, ROUTINE W REFLEX MICROSCOPIC     Status: Abnormal   Collection Time    07/03/13  4:45 PM      Result Value Ref Range   Color, Urine YELLOW  YELLOW   APPearance CLEAR  CLEAR   Specific Gravity, Urine 1.025  1.005 - 1.030   pH 6.5  5.0 - 8.0   Glucose, UA NEGATIVE  NEGATIVE mg/dL   Hgb urine dipstick LARGE (*) NEGATIVE   Bilirubin Urine NEGATIVE  NEGATIVE   Ketones, ur 15 (*) NEGATIVE mg/dL   Protein, ur NEGATIVE  NEGATIVE mg/dL   Urobilinogen, UA 1.0  0.0 - 1.0 mg/dL   Nitrite NEGATIVE  NEGATIVE   Leukocytes,  UA TRACE (*) NEGATIVE  URINE MICROSCOPIC-ADD ON     Status: Abnormal   Collection Time    07/03/13  4:45 PM      Result Value Ref Range   Squamous Epithelial / LPF FEW (*) RARE   WBC, UA 3-6  <3 WBC/hpf   RBC / HPF 7-10  <3 RBC/hpf   Bacteria, UA FEW (*) RARE   Urine-Other MUCOUS PRESENT    POCT PREGNANCY, URINE     Status: None   Collection Time    07/03/13  4:49 PM      Result Value Ref Range   Preg Test, Ur NEGATIVE  NEGATIVE  CBC WITH DIFFERENTIAL     Status: None   Collection Time    07/03/13  7:21 PM      Result Value Ref Range   WBC 7.3  4.0 -  10.5 K/uL   RBC 4.04  3.87 - 5.11 MIL/uL   Hemoglobin 13.1  12.0 - 15.0 g/dL   HCT 52.838.1  41.336.0 - 24.446.0 %   MCV 94.3  78.0 - 100.0 fL   MCH 32.4  26.0 - 34.0 pg   MCHC 34.4  30.0 - 36.0 g/dL   RDW 01.012.9  27.211.5 - 53.615.5 %   Platelets 232  150 - 400 K/uL   Neutrophils Relative % 56  43 - 77 %   Neutro Abs 4.2  1.7 - 7.7 K/uL   Lymphocytes Relative 36  12 - 46 %   Lymphs Abs 2.6  0.7 - 4.0 K/uL   Monocytes Relative 5  3 - 12 %   Monocytes Absolute 0.4  0.1 - 1.0 K/uL   Eosinophils Relative 2  0 - 5 %   Eosinophils Absolute 0.1  0.0 - 0.7 K/uL   Basophils Relative 1  0 - 1 %   Basophils Absolute 0.0  0.0 - 0.1 K/uL    [place] Transvaginal Non-ob/Pelvis Complete/ US Art/Ven Flow Abd Pelv Doppler  07/03/2013   CLINICAL DATA:  Right lower quadrant pain.  EXAM: TRANSABDOMINAL AND TRANSVAGINAL ULTRASOUND OF PELVIS  DOPPLER ULTRASOUND OF OVARIES  TECHNIQUE: Both transabdominal and transvaginal ultrasound examinations of the pelvis were performed. Transabdominal technique was performed for global imaging of the pelvis including uterus, ovaries, adnexal regions, and pelvic cul-de-sac.  It was necessary to proceed with endovaginal exam following the transabdominal exam to visualize the uterus, endometrium, ovaries and adnexa . Color and duplex Doppler ultrasound was utilized to evaluate blood flow to the ovaries.  COMPARISON:  09/11/2008  FINDINGS: Uterus  Measurements: 7.2 x 4.9 x 5.1 cm. No fibroids or other mass visualized. Uterus is retroverted.  Endometrium  Thickness: 3 mm in thickness.  No focal abnormality visualized.  Right ovary  Measurements: 3.2 x 1.4 x 1.4 cm. Normal appearance/no adnexal mass.  Left ovary  Measurements: Not visualized transabdominally or transvaginally. No adnexal masses  Other findings:  Small amount of free fluid in the pelvis.  Pulsed Doppler evaluation of both adnexa demonstrates normal low-resistance arterial and venous waveforms in the visualized right ovary.  Other findings  No free  fluid.  IMPRESSION: Retroverted uterus.  Nonvisualization of the left ovary.  No abnormality visualized.   Electronically Signed   By: Charlett NoseKevin  Dover M.D.   On: 07/03/2013 21:48    Assessment/Plan: 33 y.o. Female Pelvic Pain Uncontrolled Pain Nausea  Admit to [facility] Unit per Consult with Dr. Dorris CarnesN. Shandi Godfrey Toradol 30mg  IV Q6 Hrs PRN Phenergan 25mg  IV Q6hrs PRN Dilaudid 2mg  IV  Q4hrs PRN LR Continuous Infusion CBC with Diff in AM GC/CT via Urine Specimen Observation throughout the night Will reassess in am   EMLY, JESSICA LYNN 07/03/2013, 10:04 PM

## 2013-07-03 NOTE — MAU Note (Signed)
Had US 3 wks, ? Endometriosis. ? Ruptured cyst.  Was put on BC ? To control endometriosis.  Has been bleeding since the 19th. Never had a cycle to last this long. Wants the pain to stop and the bleeding to stop.

## 2013-07-03 NOTE — MAU Note (Signed)
Pt states has been bleeding since 19th of June. Was at MD office and was told she had ?endometriosis on June 26th. Has cyst on r ovary. Lower abd pain moreso on right side that is constant. Has taken cumulative bottle of naproxen.

## 2013-07-03 NOTE — Progress Notes (Signed)
Notified of pt's c/o lower abd pain, hx ovarian cyst r side, ?endometriosis, u/a sent. Will see pt shortly.

## 2013-07-03 NOTE — MAU Note (Signed)
Pt called to be seen. No response

## 2013-07-04 DIAGNOSIS — R102 Pelvic and perineal pain: Secondary | ICD-10-CM

## 2013-07-04 DIAGNOSIS — O26899 Other specified pregnancy related conditions, unspecified trimester: Secondary | ICD-10-CM | POA: Diagnosis present

## 2013-07-04 LAB — CBC WITH DIFFERENTIAL/PLATELET
BASOS PCT: 1 % (ref 0–1)
Eosinophils Relative: 3 % (ref 0–5)
HCT: 36.9 % (ref 36.0–46.0)
HEMOGLOBIN: 12.2 g/dL (ref 12.0–15.0)
Lymphocytes Relative: 44 % (ref 12–46)
MCH: 31.7 pg (ref 26.0–34.0)
MCHC: 33.1 g/dL (ref 30.0–36.0)
MCV: 95.8 fL (ref 78.0–100.0)
Monocytes Relative: 8 % (ref 3–12)
Neutrophils Relative %: 44 % (ref 43–77)
Platelets: 207 10*3/uL (ref 150–400)
RBC: 3.85 MIL/uL — ABNORMAL LOW (ref 3.87–5.11)
RDW: 13.1 % (ref 11.5–15.5)
WBC: 6.4 10*3/uL (ref 4.0–10.5)

## 2013-07-04 MED ORDER — OXYCODONE-ACETAMINOPHEN 5-325 MG PO TABS
1.0000 | ORAL_TABLET | ORAL | Status: DC | PRN
  Administered 2013-07-04: 2 via ORAL
  Filled 2013-07-04: qty 2

## 2013-07-04 MED ORDER — PROMETHAZINE HCL 25 MG PO TABS: 25.0000 mg | ORAL_TABLET | Freq: Four times a day (QID) | ORAL | Status: DC | PRN

## 2013-07-04 MED ORDER — OXYCODONE-ACETAMINOPHEN 5-325 MG PO TABS: 1.0000 | ORAL_TABLET | ORAL | Status: DC | PRN

## 2013-07-04 NOTE — Discharge Summary (Signed)
Physician Discharge Summary  Patient ID: Jenna Hooper MRN: [id number] DOB/AGE: [birth year] 32 y.o.  Admit date: 07/03/2013 Discharge date: 07/04/2013  Admission Diagnoses:  Discharge Diagnoses:  Active Problems:   Smoker   Dysmenorrhea   Vaginal bleeding   Cyst of ovary, right   Nausea & vomiting   Pain in female pelvis   Discharged Condition: Stable  Hospital Course: Presented to MAU 07/03/13 with persistent pelvic pain and bleeding.  Had been seen for pelvic pain by Dr. Pennie RushingHaygood on 6/18, with pelvic US showing right CL cyst.  Placed on continuous dose OCPs and possible dx of endometriosis was discussed.  Rx'd with Naproxyn, which has been ineffective for pain control.  Patient started bleeding with initiation of OCPs and has continued since 6/19, now light.  In MAU, received IV Toradol, Dilaudid, Zofran, but pain continued.  Pelvic US was WNL, and labs were WNL.  Cultures were done on urine.  Repeat CBC on 7/4 was also WNL, with WBC count 6.4.  Patient tolerated a regular diet without N/V.  D/C home with Rxs for Percocet and Phenergan.  Plan made for f/u with Dr. Pennie RushingHaygood to determine further plan of care.  I will send information to Dr. Pennie RushingHaygood regarding the patient's status.    GC/chlamydia and urine culture are pending at the time of d/c.  Consults: None  Significant Diagnostic Studies: labs:  CBCs, diffs, GC, chlamydia, urine culture and radiology: Ultrasound: WNL.  Treatments: IV hydration and analgesia: Dilaudid; Phenergan.  Discharge Exam: Blood pressure 89/48, pulse 60, temperature 97.7 F (36.5 C), temperature source Oral, resp. rate 16, height 5\' 2"  (1.575 m), weight 170 lb (77.111 kg), last menstrual period 06/19/2013, SpO2 100.00%. General appearance: alert Back: symmetric, no curvature. ROM normal. No CVA tenderness. Cardio: regular rate and rhythm, S1, S2 normal, no murmur, click, rub or gallop GI: soft, non-tender; bowel sounds normal; no masses,  no  organomegaly Pelvic: Deferred today.  Uterus small, NT.  Milder tenderness in RLQ, no rebound or guarding.  Disposition: 01-Home or Self Care     Medication List    STOP taking these medications       HYDROcodone-acetaminophen 5-325 MG per tablet  Commonly known as:  NORCO/VICODIN      TAKE these medications       cetirizine 10 MG chewable tablet  Commonly known as:  ZYRTEC  Chew 10 mg by mouth daily as needed for allergies.     levonorgestrel-ethinyl estradiol 0.1-20 MG-MCG tablet  Commonly known as:  AVIANE,ALESSE,LESSINA  Take 1 tablet by mouth daily.     naproxen 375 MG tablet  Commonly known as:  NAPROSYN  Take 375 mg by mouth 3 (three) times daily as needed for mild pain.     oxyCODONE-acetaminophen 5-325 MG per tablet  Commonly known as:  PERCOCET/ROXICET  Take 1-2 tablets by mouth every 4 (four) hours as needed for severe pain.     promethazine 25 MG tablet  Commonly known as:  PHENERGAN  Take 25 mg by mouth every 6 (six) hours as needed for nausea or vomiting.           Follow-up Information   [facility]. (Office will call and set up f/u plan with Dr. Pennie RushingHaygood)    Specialty:  Obstetrics and Gynecology   Contact information:   [address] [place] [id number] [phone]      Signed: Nigel BridgemanLATHAM, Prescious Hurless, CNM 07/04/2013, 10:30 AM

## 2013-07-04 NOTE — Progress Notes (Signed)
Patient ID: [name], female   DOB: 10-25-1980, 33 y.o.   MRN: [id number] Pt asking for food BP 99/62  Pulse 58  Temp(Src) 97.5 F (36.4 C) (Oral)  Resp 16  Ht 5\' 2"  (1.575 m)  Wt 77.111 kg (170 lb)  BMI 31.09 kg/m2  SpO2 100%  LMP 06/19/2013 Physical Examination: General appearance - alert, well appearing, and in no distress Abdomen - soft, nontender, nondistended, no masses or organomegaly Extremities - peripheral pulses normal, no pedal edema, no clubbing or cyanosis Abdominal pain US WNL If pt tolerates regular diet can dc home this morning

## 2013-07-04 NOTE — Progress Notes (Signed)
Discharge instructions reviewed with patient.  Patient states understanding of home care, medications, activity, signs/symptoms to report to Md and return MD office visit.  No home equipment needed and significant other will assist with her care at home.  Patient ambulated for discharge in stable condition with staff without incident.

## 2013-07-04 NOTE — Discharge Instructions (Signed)
Pelvic Pain, Female Female pelvic pain can be caused by many different things and start from a variety of places. Pelvic pain refers to pain that is located in the lower half of the abdomen and between your hips. The pain may occur over a short period of time (acute) or may be reoccurring (chronic). The cause of pelvic pain may be related to disorders affecting the female reproductive organs (gynecologic), but it may also be related to the bladder, kidney stones, an intestinal complication, or muscle or skeletal problems. Getting help right away for pelvic pain is important, especially if there has been severe, sharp, or a sudden onset of unusual pain. It is also important to get help right away because some types of pelvic pain can be life threatening.  CAUSES  Below are only some of the causes of pelvic pain. The causes of pelvic pain can be in one of several categories.   Gynecologic.  Pelvic inflammatory disease.  Sexually transmitted infection.  Ovarian cyst or a twisted ovarian ligament (ovarian torsion).  Uterine lining that grows outside the uterus (endometriosis).  Fibroids, cysts, or tumors.  Ovulation.  Pregnancy.  Pregnancy that occurs outside the uterus (ectopic pregnancy).  Miscarriage.  Labor.  Abruption of the placenta or ruptured uterus.  Infection.  Uterine infection (endometritis).  Bladder infection.  Diverticulitis.  Miscarriage related to a uterine infection (septic abortion).  Bladder.  Inflammation of the bladder (cystitis).  Kidney stone(s).  Gastrointenstinal.  Constipation.  Diverticulitis.  Neurologic.  Trauma.  Feeling pelvic pain because of mental or emotional causes (psychosomatic).  Cancers of the bowel or pelvis. EVALUATION  Your caregiver will want to take a careful history of your concerns. This includes recent changes in your health, a careful gynecologic history of your periods (menses), and a sexual history. Obtaining  your family history and medical history is also important. Your caregiver may suggest a pelvic exam. A pelvic exam will help identify the location and severity of the pain. It also helps in the evaluation of which organ system may be involved. In order to identify the cause of the pelvic pain and be properly treated, your caregiver may order tests. These tests may include:   A pregnancy test.  Pelvic ultrasonography.  An X-ray exam of the abdomen.  A urinalysis or evaluation of vaginal discharge.  Blood tests. HOME CARE INSTRUCTIONS   Only take over-the-counter or prescription medicines for pain, discomfort, or fever as directed by your caregiver.   Rest as directed by your caregiver.   Eat a balanced diet.   Drink enough fluids to make your urine clear or pale yellow, or as directed.   Avoid sexual intercourse if it causes pain.   Apply warm or cold compresses to the lower abdomen depending on which one helps the pain.   Avoid stressful situations.   Keep a journal of your pelvic pain. Write down when it started, where the pain is located, and if there are things that seem to be associated with the pain, such as food or your menstrual cycle.  Follow up with your caregiver as directed.  SEEK MEDICAL CARE IF:  Your medicine does not help your pain.  You have abnormal vaginal discharge. SEEK IMMEDIATE MEDICAL CARE IF:   You have heavy bleeding from the vagina.   Your pelvic pain increases.   You feel lightheaded or faint.   You have chills.   You have pain with urination or blood in your urine.   You have uncontrolled   diarrhea or vomiting.   You have a fever or persistent symptoms for more than 3 days.  You have a fever and your symptoms suddenly get worse.   You are being physically or sexually abused.  MAKE SURE YOU:  Understand these instructions.  Will watch your condition.  Will get help if you are not doing well or get worse. Document  Released: 11/15/2003 Document Revised: 06/19/2011 Document Reviewed: 04/09/2011 ExitCare Patient Information 2015 [employer]. This information is not intended to replace advice given to you by your health care provider. Make sure you discuss any questions you have with your health care provider.  

## 2013-07-05 LAB — URINE CULTURE
CULTURE: NO GROWTH
Colony Count: NO GROWTH

## 2013-07-06 LAB — GC/CHLAMYDIA PROBE AMP
CT PROBE, AMP APTIMA: NEGATIVE
GC Probe RNA: NEGATIVE

## 2013-10-06 ENCOUNTER — Inpatient Hospital Stay (HOSPITAL_COMMUNITY)
Admission: AD | Admit: 2013-10-06 | Discharge: 2013-10-06 | Disposition: A | Discharge status: Home / Self Care | Payer: PRIVATE HEALTH INSURANCE | Source: Ambulatory Visit | Attending: Obstetrics and Gynecology | Admitting: Obstetrics and Gynecology

## 2013-10-06 ENCOUNTER — Encounter (HOSPITAL_COMMUNITY): Payer: Self-pay | Admitting: General Practice

## 2013-10-06 DIAGNOSIS — F17200 Nicotine dependence, unspecified, uncomplicated: Secondary | ICD-10-CM | POA: Insufficient documentation

## 2013-10-06 DIAGNOSIS — R102 Pelvic and perineal pain: Secondary | ICD-10-CM

## 2013-10-06 DIAGNOSIS — A5901 Trichomonal vulvovaginitis: Secondary | ICD-10-CM | POA: Insufficient documentation

## 2013-10-06 DIAGNOSIS — N390 Urinary tract infection, site not specified: Secondary | ICD-10-CM | POA: Insufficient documentation

## 2013-10-06 LAB — URINE MICROSCOPIC-ADD ON

## 2013-10-06 LAB — URINALYSIS, ROUTINE W REFLEX MICROSCOPIC
Bilirubin Urine: NEGATIVE
Glucose, UA: NEGATIVE mg/dL
Hgb urine dipstick: NEGATIVE
KETONES UR: NEGATIVE mg/dL
NITRITE: NEGATIVE
PROTEIN: NEGATIVE mg/dL
Specific Gravity, Urine: 1.015 (ref 1.005–1.030)
UROBILINOGEN UA: 0.2 mg/dL (ref 0.0–1.0)
pH: 6.5 (ref 5.0–8.0)

## 2013-10-06 MED ORDER — METRONIDAZOLE 500 MG PO TABS: 2000.0000 mg | ORAL_TABLET | Freq: Once | ORAL | Status: AC

## 2013-10-06 MED ORDER — KETOROLAC TROMETHAMINE 60 MG/2ML IM SOLN
60.0000 mg | Freq: Once | INTRAMUSCULAR | Status: AC
  Administered 2013-10-06: 60 mg via INTRAMUSCULAR
  Filled 2013-10-06: qty 2

## 2013-10-06 NOTE — Discharge Instructions (Signed)
Pelvic Pain Female pelvic pain can be caused by many different things and start from a variety of places. Pelvic pain refers to pain that is located in the lower half of the abdomen and between your hips. The pain may occur over a short period of time (acute) or may be reoccurring (chronic). The cause of pelvic pain may be related to disorders affecting the female reproductive organs (gynecologic), but it may also be related to the bladder, kidney stones, an intestinal complication, or muscle or skeletal problems. Getting help right away for pelvic pain is important, especially if there has been severe, sharp, or a sudden onset of unusual pain. It is also important to get help right away because some types of pelvic pain can be life threatening.  CAUSES  Below are only some of the causes of pelvic pain. The causes of pelvic pain can be in one of several categories.   Gynecologic.  Pelvic inflammatory disease.  Sexually transmitted infection.  Ovarian cyst or a twisted ovarian ligament (ovarian torsion).  Uterine lining that grows outside the uterus (endometriosis).  Fibroids, cysts, or tumors.  Ovulation.  Pregnancy.  Pregnancy that occurs outside the uterus (ectopic pregnancy).  Miscarriage.  Labor.  Abruption of the placenta or ruptured uterus.  Infection.  Uterine infection (endometritis).  Bladder infection.  Diverticulitis.  Miscarriage related to a uterine infection (septic abortion).  Bladder.  Inflammation of the bladder (cystitis).  Kidney stone(s).  Gastrointestinal.  Constipation.  Diverticulitis.  Neurologic.  Trauma.  Feeling pelvic pain because of mental or emotional causes (psychosomatic).  Cancers of the bowel or pelvis. EVALUATION  Your caregiver will want to take a careful history of your concerns. This includes recent changes in your health, a careful gynecologic history of your periods (menses), and a sexual history. Obtaining your family  history and medical history is also important. Your caregiver may suggest a pelvic exam. A pelvic exam will help identify the location and severity of the pain. It also helps in the evaluation of which organ system may be involved. In order to identify the cause of the pelvic pain and be properly treated, your caregiver may order tests. These tests may include:   A pregnancy test.  Pelvic ultrasonography.  An X-ray exam of the abdomen.  A urinalysis or evaluation of vaginal discharge.  Blood tests. HOME CARE INSTRUCTIONS   Only take over-the-counter or prescription medicines for pain, discomfort, or fever as directed by your caregiver.   Rest as directed by your caregiver.   Eat a balanced diet.   Drink enough fluids to make your urine clear or pale yellow, or as directed.   Avoid sexual intercourse if it causes pain.   Apply warm or cold compresses to the lower abdomen depending on which one helps the pain.   Avoid stressful situations.   Keep a journal of your pelvic pain. Write down when it started, where the pain is located, and if there are things that seem to be associated with the pain, such as food or your menstrual cycle.  Follow up with your caregiver as directed.  SEEK MEDICAL CARE IF:  Your medicine does not help your pain.  You have abnormal vaginal discharge. SEEK IMMEDIATE MEDICAL CARE IF:   You have heavy bleeding from the vagina.   Your pelvic pain increases.   You feel light-headed or faint.   You have chills.   You have pain with urination or blood in your urine.   You have uncontrolled diarrhea   or vomiting.   You have a fever or persistent symptoms for more than 3 days.  You have a fever and your symptoms suddenly get worse.   You are being physically or sexually abused.  MAKE SURE YOU:  Understand these instructions.  Will watch your condition.  Will get help if you are not doing well or get worse. Document Released:  11/15/2003 Document Revised: 05/04/2013 Document Reviewed: 04/09/2011 ExitCare Patient Information 2015 [employer]. This information is not intended to replace advice given to you by your health care provider. Make sure you discuss any questions you have with your health care provider.  

## 2013-10-06 NOTE — MAU Note (Signed)
Dx'd with endometriosis by MD 2 months ago, was admitted for bleeding.  No cycle for the last 3 months.  Started period on the 26th, lasted 5 days.  Woke up during the night with abdominal cramps, did not have BM but has rectal bleeding.

## 2013-10-06 NOTE — MAU Provider Note (Signed)
MAU Addendum Note  Results for orders placed during the hospital encounter of 10/06/13 (from the past 24 hour(s))  URINALYSIS, ROUTINE W REFLEX MICROSCOPIC     Status: Abnormal   Collection Time    10/06/13 12:30 PM      Result Value Ref Range   Color, Urine YELLOW  YELLOW   APPearance HAZY (*) CLEAR   Specific Gravity, Urine 1.015  1.005 - 1.030   pH 6.5  5.0 - 8.0   Glucose, UA NEGATIVE  NEGATIVE mg/dL   Hgb urine dipstick NEGATIVE  NEGATIVE   Bilirubin Urine NEGATIVE  NEGATIVE   Ketones, ur NEGATIVE  NEGATIVE mg/dL   Protein, ur NEGATIVE  NEGATIVE mg/dL   Urobilinogen, UA 0.2  0.0 - 1.0 mg/dL   Nitrite NEGATIVE  NEGATIVE   Leukocytes, UA MODERATE (*) NEGATIVE  URINE MICROSCOPIC-ADD ON     Status: Abnormal   Collection Time    10/06/13 12:30 PM      Result Value Ref Range   Squamous Epithelial / LPF MANY (*) RARE   WBC, UA 7-10  <3 WBC/hpf   RBC / HPF 3-6  <3 RBC/hpf   Bacteria, UA MANY (*) RARE   Urine-Other TRICHOMONAS PRESENT     UPT negative Trich Positive UTI positive Flagyl 2g x1  FU in the office tomorrow    Lexxi Koslow, CNM, MSN 10/06/2013. 3:45 PM

## 2013-10-06 NOTE — MAU Provider Note (Signed)
Seward SpeckShareena N Cuccia Colon Flattery(Simmie Reeder) is a 33 y.o. G2P1 non pregnant female c/o reoccurring left side abd and pelvic pain and rectal bleeding.  [facility] for [facility] same problem.  US from July 2015 negative.See EPIC and [facility] notes    History     Patient Active Problem List   Diagnosis Date Noted  . Smoker 07/03/2013  . Dysmenorrhea 07/03/2013  . Vaginal bleeding 07/03/2013  . Cyst of ovary, right 07/03/2013  . Nausea & vomiting 07/03/2013  . Pain in female pelvis 07/03/2013    Chief Complaint  Patient presents with  . Abdominal Pain  . Rectal Bleeding   HPI  OB History   Grav Para Term Preterm Abortions TAB SAB Ect Mult Living   2 1 1  0 1 1 0 0 0 1      Past Medical History  Diagnosis Date  . No pertinent past medical history   . Endometriosis   . Anxiety     Past Surgical History  Procedure Laterality Date  . Wisdom teeth extraction    . Dilation and curettage of uterus      History reviewed. No pertinent family history.  History  Substance Use Topics  . Smoking status: Current Every Day Smoker -- 0.50 packs/day  . Smokeless tobacco: Never Used  . Alcohol Use: No    Allergies: No Known Allergies  No prescriptions prior to admission    ROS See HPI above, all other systems are negative  Physical Exam   Blood pressure 126/74, pulse 104, temperature 98.6 F (37 C), temperature source Oral, resp. rate 18, height 5\' 2"  (1.575 m), weight 77.293 kg (170 lb 6.4 oz), last menstrual period 09/25/2013.  Physical Exam  Chest: CTA Heart: RRR w/o murmer Ext:  WNL ABD: Soft, non tender to palpation, no rebound or guarding SVE:   ED Course  Assessment: abd tenderness on the left side   Plan: Toradol 60mg  IM x1 DC to home with an [facility] 13[facility] Dr. Sande Brothersoberts   Banner Huckaba, CNM, MSN 10/06/2013. 2:48 PM

## 2013-11-02 ENCOUNTER — Encounter (HOSPITAL_COMMUNITY): Payer: Self-pay | Admitting: General Practice

## 2015-08-18 IMAGING — US US TRANSVAGINAL NON-OB
1 series · 13 of 25 positions shown · non-contrast
Comparison: 09/11/2008

CLINICAL DATA: Right lower quadrant pain.

EXAM:
TRANSABDOMINAL AND TRANSVAGINAL ULTRASOUND OF PELVIS
DOPPLER ULTRASOUND OF OVARIES
TECHNIQUE: Both transabdominal and transvaginal ultrasound examinations of the
pelvis were performed. Transabdominal technique was performed for
global imaging of the pelvis including uterus, ovaries, adnexal
regions, and pelvic cul-de-sac.
It was necessary to proceed with endovaginal exam following the
transabdominal exam to visualize the uterus, endometrium, ovaries
and adnexa . Color and duplex Doppler ultrasound was utilized to
evaluate blood flow to the ovaries.

[Series 1: us pelvis complete · 13 of 54 slices shown]
[im 1/54]
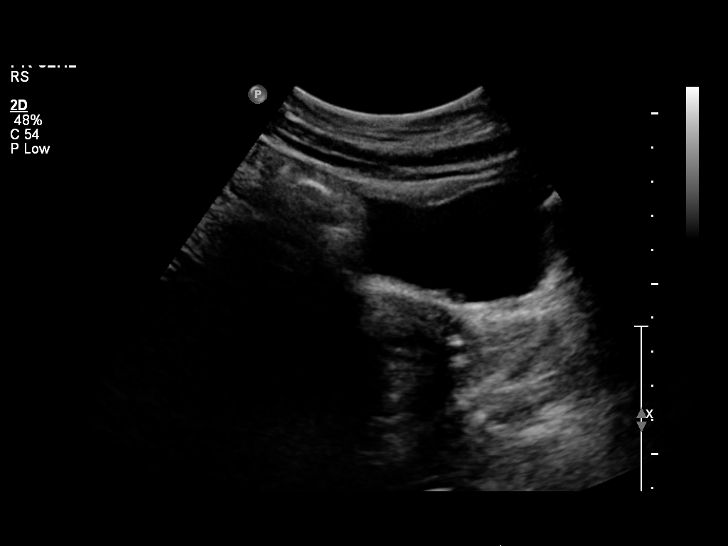
[im 5/54]
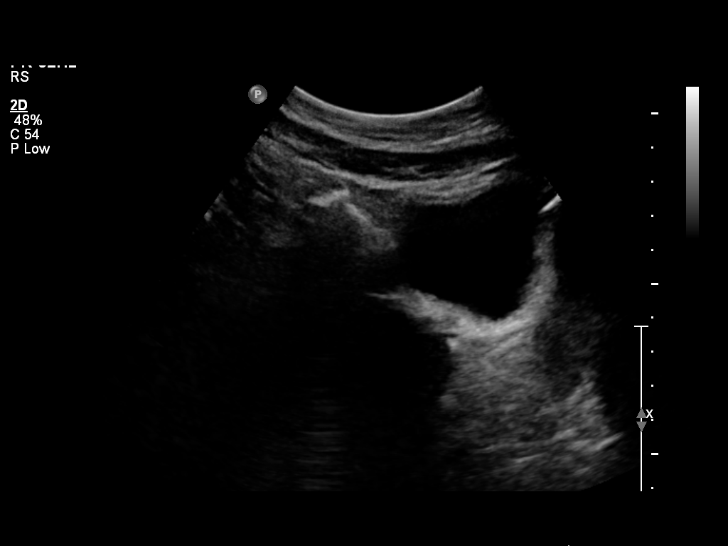
[im 9/54]
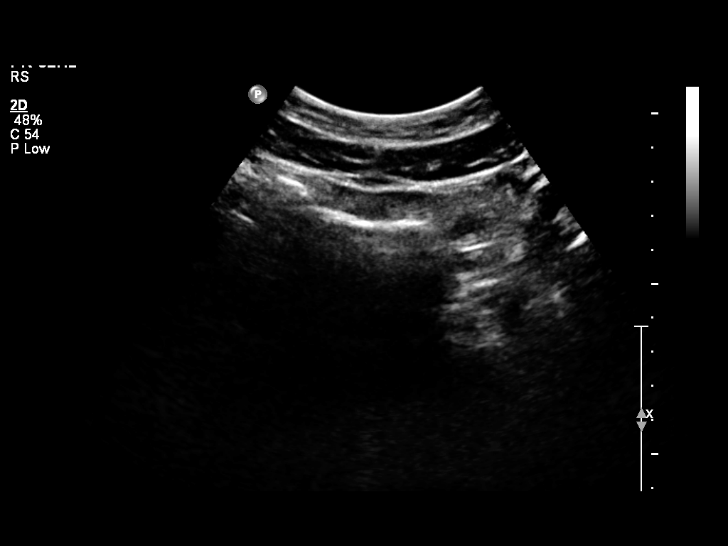
[im 14/54]
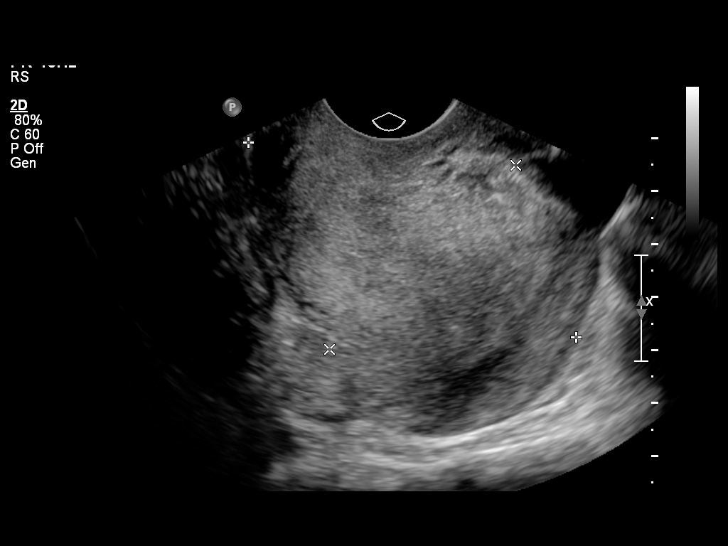
[im 18/54]
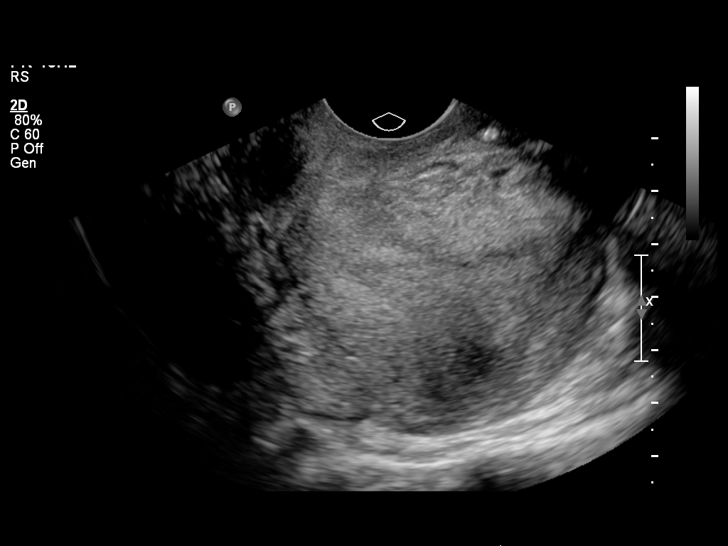
[im 23/54]
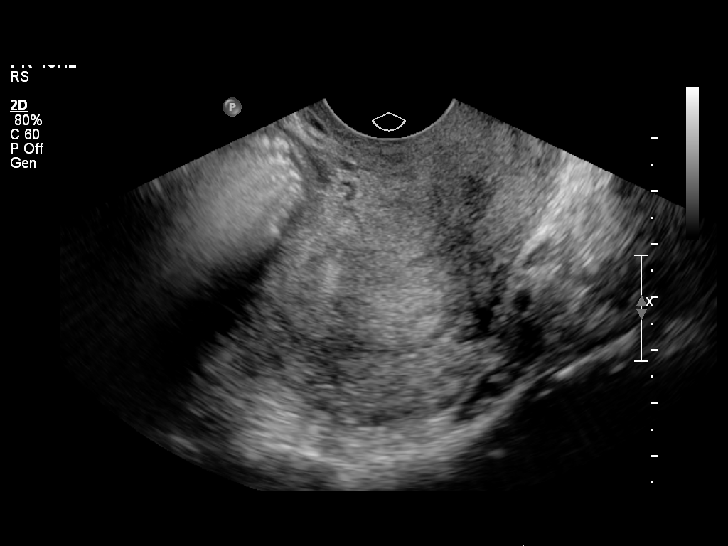
[im 27/54]
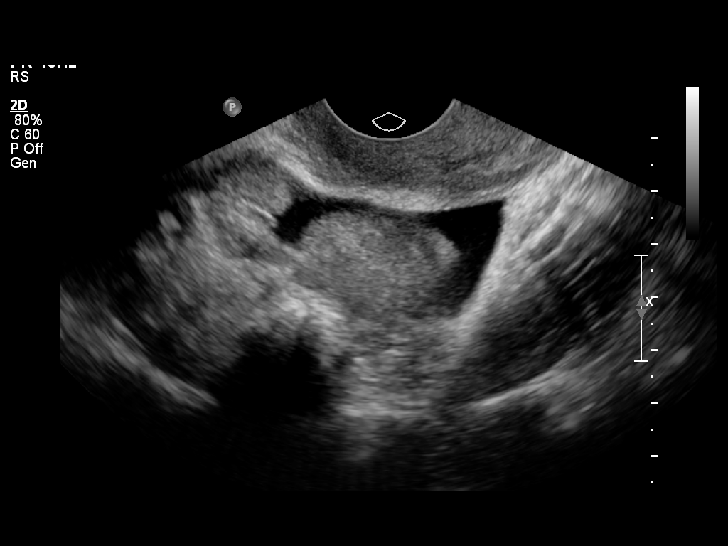
[im 31/54]
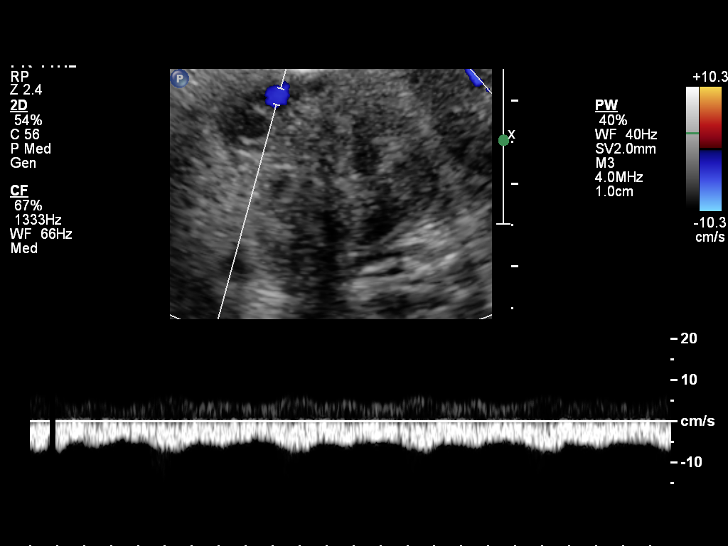
[im 36/54]
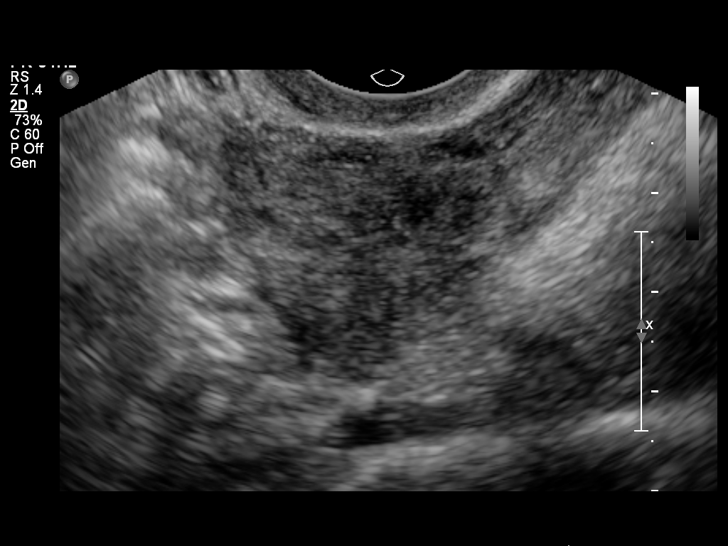
[im 40/54]
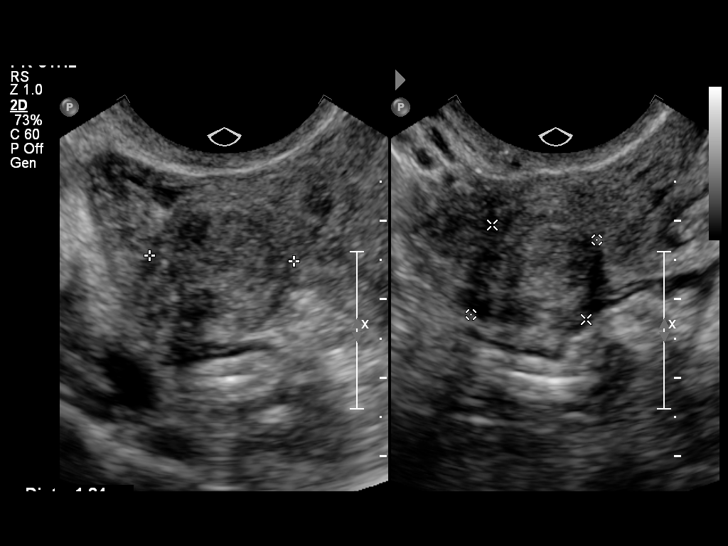
[im 45/54]
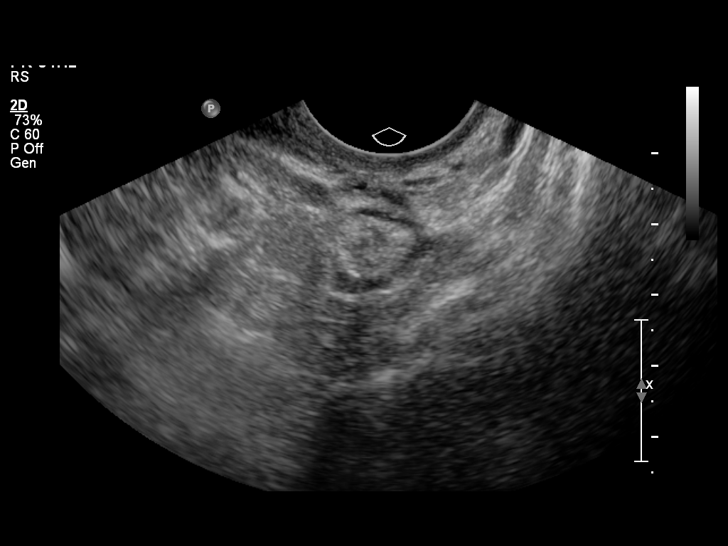
[im 49/54]
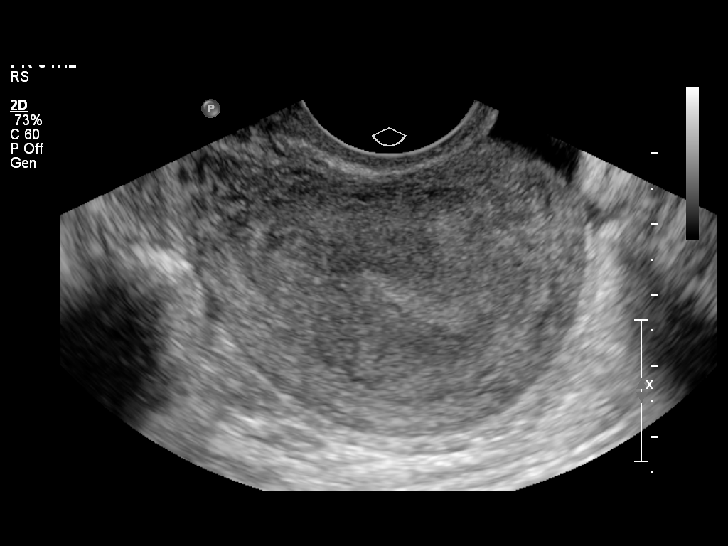
[im 54/54]
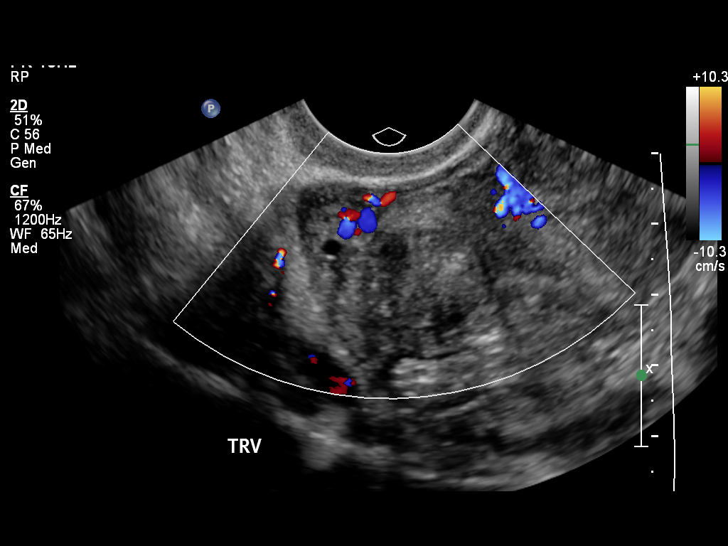

[13 of 25 positions shown; findings below may reference images not displayed]

FINDINGS: Uterus

Measurements: 7.2 x 4.9 x 5.1 cm. No fibroids or other mass
visualized. Uterus is retroverted.

Endometrium

Thickness: 3 mm in thickness.  No focal abnormality visualized.

Right ovary

Measurements: 3.2 x 1.4 x 1.4 cm. Normal appearance/no adnexal mass.

Left ovary

Measurements: Not visualized transabdominally or transvaginally. No
adnexal masses

Other findings:  Small amount of free fluid in the pelvis.

Pulsed Doppler evaluation of both adnexa demonstrates normal
low-resistance arterial and venous waveforms in the visualized right
ovary.

Other findings

No free fluid.
IMPRESSION: Retroverted uterus.

Nonvisualization of the left ovary.

No abnormality visualized.
